# Patient Record
Sex: Male | Born: 1990 | Race: Black or African American | Hispanic: No | Marital: Single | Smoking: Current some day smoker
Health system: Southern US, Community
[De-identification: ages and names within clinical notes are randomized; demographics above are authoritative.]

## PROBLEM LIST (undated history)

## (undated) DIAGNOSIS — S62109A Fracture of unspecified carpal bone, unspecified wrist, initial encounter for closed fracture: Secondary | ICD-10-CM

---
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
## 2013-11-04 ENCOUNTER — Encounter (HOSPITAL_BASED_OUTPATIENT_CLINIC_OR_DEPARTMENT_OTHER): Payer: Self-pay | Admitting: Emergency Medicine

## 2013-11-04 ENCOUNTER — Emergency Department (HOSPITAL_BASED_OUTPATIENT_CLINIC_OR_DEPARTMENT_OTHER)
Admission: EM | Admit: 2013-11-04 | Discharge: 2013-11-04 | Disposition: A | Discharge status: Home / Self Care | Payer: Managed Care, Other (non HMO) | Attending: Emergency Medicine | Admitting: Emergency Medicine

## 2013-11-04 DIAGNOSIS — R369 Urethral discharge, unspecified: Secondary | ICD-10-CM | POA: Insufficient documentation

## 2013-11-04 DIAGNOSIS — R300 Dysuria: Secondary | ICD-10-CM | POA: Insufficient documentation

## 2013-11-04 DIAGNOSIS — J45909 Unspecified asthma, uncomplicated: Secondary | ICD-10-CM | POA: Insufficient documentation

## 2013-11-04 DIAGNOSIS — N509 Disorder of male genital organs, unspecified: Secondary | ICD-10-CM | POA: Insufficient documentation

## 2013-11-04 DIAGNOSIS — Z87828 Personal history of other (healed) physical injury and trauma: Secondary | ICD-10-CM | POA: Insufficient documentation

## 2013-11-04 LAB — URINALYSIS, ROUTINE W REFLEX MICROSCOPIC
Bilirubin Urine: NEGATIVE
Glucose, UA: NEGATIVE mg/dL
Hgb urine dipstick: NEGATIVE
Ketones, ur: NEGATIVE mg/dL
Leukocytes, UA: NEGATIVE
Nitrite: NEGATIVE
Protein, ur: NEGATIVE mg/dL
Specific Gravity, Urine: 1.019 (ref 1.005–1.030)
Urobilinogen, UA: 0.2 mg/dL (ref 0.0–1.0)
pH: 6.5 (ref 5.0–8.0)

## 2013-11-04 MED ORDER — AZITHROMYCIN 250 MG PO TABS
1000.0000 mg | ORAL_TABLET | Freq: Once | ORAL | Status: AC
  Administered 2013-11-04: 1000 mg via ORAL
  Filled 2013-11-04: qty 4

## 2013-11-04 MED ORDER — CEFTRIAXONE SODIUM 250 MG IJ SOLR
250.0000 mg | Freq: Once | INTRAMUSCULAR | Status: AC
  Administered 2013-11-04: 250 mg via INTRAMUSCULAR
  Filled 2013-11-04: qty 250
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
## 2013-11-04 NOTE — ED Notes (Signed)
Pt states has had burning on urination since Thursday morning concerned he may have std

## 2013-11-04 NOTE — ED Provider Notes (Signed)
Medical screening examination/treatment/procedure(s) were performed by non-physician practitioner and as supervising physician I was immediately available for consultation/collaboration.  EKG Interpretation   None         Charles B. Sheldon, MD 11/04/13 1334 

## 2013-11-05 LAB — URINE CULTURE
Colony Count: NO GROWTH
Culture: NO GROWTH

## 2013-11-05 LAB — GC/CHLAMYDIA PROBE AMP
CT Probe RNA: NEGATIVE
GC Probe RNA: NEGATIVE

## 2014-01-25 ENCOUNTER — Emergency Department (HOSPITAL_BASED_OUTPATIENT_CLINIC_OR_DEPARTMENT_OTHER): Payer: Managed Care, Other (non HMO)

## 2014-01-25 ENCOUNTER — Emergency Department (HOSPITAL_BASED_OUTPATIENT_CLINIC_OR_DEPARTMENT_OTHER)
Admission: EM | Admit: 2014-01-25 | Discharge: 2014-01-25 | Disposition: A | Discharge status: Home / Self Care | Payer: Managed Care, Other (non HMO) | Attending: Emergency Medicine | Admitting: Emergency Medicine

## 2014-01-25 ENCOUNTER — Encounter (HOSPITAL_BASED_OUTPATIENT_CLINIC_OR_DEPARTMENT_OTHER): Payer: Self-pay | Admitting: Emergency Medicine

## 2014-01-25 DIAGNOSIS — S81009A Unspecified open wound, unspecified knee, initial encounter: Secondary | ICD-10-CM | POA: Insufficient documentation

## 2014-01-25 DIAGNOSIS — J45909 Unspecified asthma, uncomplicated: Secondary | ICD-10-CM | POA: Insufficient documentation

## 2014-01-25 DIAGNOSIS — Y9289 Other specified places as the place of occurrence of the external cause: Secondary | ICD-10-CM | POA: Insufficient documentation

## 2014-01-25 DIAGNOSIS — Y9389 Activity, other specified: Secondary | ICD-10-CM | POA: Insufficient documentation

## 2014-01-25 DIAGNOSIS — S91009A Unspecified open wound, unspecified ankle, initial encounter: Principal | ICD-10-CM

## 2014-01-25 DIAGNOSIS — S81832A Puncture wound without foreign body, left lower leg, initial encounter: Secondary | ICD-10-CM

## 2014-01-25 DIAGNOSIS — S81809A Unspecified open wound, unspecified lower leg, initial encounter: Principal | ICD-10-CM

## 2014-01-25 DIAGNOSIS — W3400XA Accidental discharge from unspecified firearms or gun, initial encounter: Secondary | ICD-10-CM | POA: Insufficient documentation

## 2014-01-25 DIAGNOSIS — Z23 Encounter for immunization: Secondary | ICD-10-CM | POA: Insufficient documentation

## 2014-01-25 DIAGNOSIS — F17200 Nicotine dependence, unspecified, uncomplicated: Secondary | ICD-10-CM | POA: Insufficient documentation

## 2014-01-25 MED ORDER — OXYCODONE-ACETAMINOPHEN 5-325 MG PO TABS: 1.0000 | ORAL_TABLET | ORAL | Status: AC | PRN

## 2014-01-25 MED ORDER — NAPROXEN 500 MG PO TABS: 500.0000 mg | ORAL_TABLET | Freq: Two times a day (BID) | ORAL | Status: AC

## 2014-01-25 MED ORDER — DEXTROSE 5 % IV SOLN
1.0000 g | Freq: Once | INTRAVENOUS | Status: AC
  Administered 2014-01-25: 1 g via INTRAVENOUS

## 2014-01-25 MED ORDER — SODIUM CHLORIDE 0.9 % IV SOLN
Freq: Once | INTRAVENOUS | Status: AC
  Administered 2014-01-25: 17:00:00 via INTRAVENOUS

## 2014-01-25 MED ORDER — FENTANYL CITRATE 0.05 MG/ML IJ SOLN
100.0000 ug | Freq: Once | INTRAMUSCULAR | Status: AC
  Administered 2014-01-25: 100 ug via INTRAVENOUS
  Filled 2014-01-25: qty 2

## 2014-01-25 MED ORDER — CEFTRIAXONE SODIUM 1 G IJ SOLR
INTRAMUSCULAR | Status: AC
  Filled 2014-01-25: qty 10

## 2014-01-25 MED ORDER — IOHEXOL 350 MG/ML SOLN
100.0000 mL | Freq: Once | INTRAVENOUS | Status: AC | PRN
  Administered 2014-01-25: 100 mL via INTRAVENOUS

## 2014-01-25 MED ORDER — TETANUS-DIPHTH-ACELL PERTUSSIS 5-2.5-18.5 LF-MCG/0.5 IM SUSP
0.5000 mL | Freq: Once | INTRAMUSCULAR | Status: AC
  Administered 2014-01-25: 0.5 mL via INTRAMUSCULAR
  Filled 2014-01-25: qty 0.5

## 2014-01-25 NOTE — ED Notes (Signed)
[employer] at bedside 
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
## 2014-01-25 NOTE — ED Provider Notes (Signed)
Medical screening examination/treatment/procedure(s) were conducted as a shared visit with non-physician practitioner(s) and myself.  I personally evaluated the patient during the encounter     .Face to face Exam:  General:  A&Ox3 HEENT:  Atraumatic Resp:  Normal effort Abd:  Nondistended Neuro:No focal deficits     Nelia Shiobert L Kayman Snuffer, MD 01/25/14 1750
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
## 2014-01-25 NOTE — ED Notes (Addendum)
Rx x 2 given for oxycodone and naproxen- d/c home with family to drive- verbalizes understanding to [facility] any concerns. Note for work given per [place], NP

## 2014-01-25 NOTE — ED Notes (Signed)
Patient transported to CT 

## 2014-06-15 ENCOUNTER — Encounter (HOSPITAL_BASED_OUTPATIENT_CLINIC_OR_DEPARTMENT_OTHER): Payer: Self-pay | Admitting: Emergency Medicine

## 2014-06-15 ENCOUNTER — Emergency Department (HOSPITAL_BASED_OUTPATIENT_CLINIC_OR_DEPARTMENT_OTHER)
Admission: EM | Admit: 2014-06-15 | Discharge: 2014-06-15 | Disposition: A | Discharge status: Home / Self Care | Payer: Managed Care, Other (non HMO) | Attending: Emergency Medicine | Admitting: Emergency Medicine

## 2014-06-15 DIAGNOSIS — F17200 Nicotine dependence, unspecified, uncomplicated: Secondary | ICD-10-CM | POA: Insufficient documentation

## 2014-06-15 DIAGNOSIS — K047 Periapical abscess without sinus: Secondary | ICD-10-CM | POA: Insufficient documentation

## 2014-06-15 DIAGNOSIS — Z8781 Personal history of (healed) traumatic fracture: Secondary | ICD-10-CM | POA: Insufficient documentation

## 2014-06-15 DIAGNOSIS — Z791 Long term (current) use of non-steroidal anti-inflammatories (NSAID): Secondary | ICD-10-CM | POA: Insufficient documentation

## 2014-06-15 DIAGNOSIS — J45909 Unspecified asthma, uncomplicated: Secondary | ICD-10-CM | POA: Insufficient documentation

## 2014-06-15 DIAGNOSIS — K029 Dental caries, unspecified: Secondary | ICD-10-CM | POA: Insufficient documentation

## 2014-06-15 MED ORDER — HYDROCODONE-ACETAMINOPHEN 5-325 MG PO TABS: 1.0000 | ORAL_TABLET | Freq: Four times a day (QID) | ORAL | Status: AC | PRN

## 2014-06-15 MED ORDER — CLINDAMYCIN HCL 150 MG PO CAPS: 150.0000 mg | ORAL_CAPSULE | Freq: Three times a day (TID) | ORAL | Status: AC

## 2014-06-15 NOTE — Discharge Instructions (Signed)
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
## 2014-06-15 NOTE — ED Notes (Addendum)
Pt c/o dental pain x 3 days facial swelling x 1 day

## 2014-06-15 NOTE — ED Provider Notes (Signed)
Medical screening examination/treatment/procedure(s) were performed by non-physician practitioner and as supervising physician I was immediately available for consultation/collaboration.   EKG Interpretation None       Martha K Linker, MD 06/15/14 1542 

## 2018-01-22 ENCOUNTER — Other Ambulatory Visit: Payer: Self-pay

## 2018-01-22 ENCOUNTER — Emergency Department (HOSPITAL_BASED_OUTPATIENT_CLINIC_OR_DEPARTMENT_OTHER)
Admission: EM | Admit: 2018-01-22 | Discharge: 2018-01-22 | Disposition: A | Discharge status: Home / Self Care | Payer: Managed Care, Other (non HMO) | Attending: Emergency Medicine | Admitting: Emergency Medicine

## 2018-01-22 ENCOUNTER — Encounter (HOSPITAL_BASED_OUTPATIENT_CLINIC_OR_DEPARTMENT_OTHER): Payer: Self-pay | Admitting: *Deleted

## 2018-01-22 DIAGNOSIS — N342 Other urethritis: Secondary | ICD-10-CM | POA: Insufficient documentation

## 2018-01-22 DIAGNOSIS — F17290 Nicotine dependence, other tobacco product, uncomplicated: Secondary | ICD-10-CM | POA: Insufficient documentation

## 2018-01-22 DIAGNOSIS — F17210 Nicotine dependence, cigarettes, uncomplicated: Secondary | ICD-10-CM | POA: Insufficient documentation

## 2018-01-22 DIAGNOSIS — J45909 Unspecified asthma, uncomplicated: Secondary | ICD-10-CM | POA: Insufficient documentation

## 2018-01-22 DIAGNOSIS — F1210 Cannabis abuse, uncomplicated: Secondary | ICD-10-CM | POA: Insufficient documentation

## 2018-01-22 MED ORDER — AZITHROMYCIN 250 MG PO TABS
1000.0000 mg | ORAL_TABLET | Freq: Once | ORAL | Status: AC
  Administered 2018-01-22: 1000 mg via ORAL
  Filled 2018-01-22: qty 4

## 2018-01-22 MED ORDER — CEFTRIAXONE SODIUM 250 MG IJ SOLR
250.0000 mg | Freq: Once | INTRAMUSCULAR | Status: AC
  Administered 2018-01-22: 250 mg via INTRAMUSCULAR
  Filled 2018-01-22: qty 250

## 2018-01-22 NOTE — ED Notes (Signed)
Pt encourage to provide urine
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
## 2018-01-22 NOTE — ED Triage Notes (Signed)
Painful urination x 2-3 days. Penile discharge today

## 2018-01-23 LAB — GC/CHLAMYDIA PROBE AMP (~~LOC~~) NOT AT ARMC
Chlamydia: NEGATIVE
Neisseria Gonorrhea: POSITIVE — AB

## 2020-02-12 ENCOUNTER — Other Ambulatory Visit: Payer: Self-pay

## 2020-02-12 ENCOUNTER — Emergency Department (HOSPITAL_BASED_OUTPATIENT_CLINIC_OR_DEPARTMENT_OTHER)
Admission: EM | Admit: 2020-02-12 | Discharge: 2020-02-12 | Disposition: A | Discharge status: Home / Self Care | Payer: 59 | Attending: Emergency Medicine | Admitting: Emergency Medicine

## 2020-02-12 ENCOUNTER — Encounter (HOSPITAL_BASED_OUTPATIENT_CLINIC_OR_DEPARTMENT_OTHER): Payer: Self-pay | Admitting: Emergency Medicine

## 2020-02-12 ENCOUNTER — Emergency Department (HOSPITAL_BASED_OUTPATIENT_CLINIC_OR_DEPARTMENT_OTHER): Payer: 59

## 2020-02-12 DIAGNOSIS — M25521 Pain in right elbow: Secondary | ICD-10-CM | POA: Diagnosis present

## 2020-02-12 DIAGNOSIS — J45909 Unspecified asthma, uncomplicated: Secondary | ICD-10-CM | POA: Insufficient documentation

## 2020-02-12 DIAGNOSIS — Z79899 Other long term (current) drug therapy: Secondary | ICD-10-CM | POA: Diagnosis not present

## 2020-02-12 DIAGNOSIS — F17210 Nicotine dependence, cigarettes, uncomplicated: Secondary | ICD-10-CM | POA: Diagnosis not present

## 2020-02-12 DIAGNOSIS — Z791 Long term (current) use of non-steroidal anti-inflammatories (NSAID): Secondary | ICD-10-CM | POA: Diagnosis not present

## 2020-02-12 DIAGNOSIS — M7701 Medial epicondylitis, right elbow: Secondary | ICD-10-CM | POA: Diagnosis not present

## 2020-02-12 DIAGNOSIS — G5621 Lesion of ulnar nerve, right upper limb: Secondary | ICD-10-CM

## 2020-02-12 MED ORDER — KETOROLAC TROMETHAMINE 15 MG/ML IJ SOLN
15.0000 mg | Freq: Once | INTRAMUSCULAR | Status: AC
  Administered 2020-02-12: 15 mg via INTRAMUSCULAR
  Filled 2020-02-12: qty 1

## 2020-02-12 MED ORDER — METHOCARBAMOL 500 MG PO TABS: 500.0000 mg | ORAL_TABLET | Freq: Two times a day (BID) | ORAL | 0 refills | Status: AC

## 2020-02-12 MED ORDER — OXYCODONE-ACETAMINOPHEN 5-325 MG PO TABS
1.0000 | ORAL_TABLET | Freq: Once | ORAL | Status: AC
  Administered 2020-02-12: 17:00:00 1 via ORAL
  Filled 2020-02-12: qty 1

## 2020-02-12 MED ORDER — METHOCARBAMOL 500 MG PO TABS
500.0000 mg | ORAL_TABLET | Freq: Once | ORAL | Status: AC
  Administered 2020-02-12: 19:00:00 500 mg via ORAL
  Filled 2020-02-12: qty 1

## 2020-02-12 NOTE — ED Notes (Signed)
Pt resting calmly in chair.

## 2020-02-12 NOTE — ED Triage Notes (Signed)
R arm pain today since 1pm. Denies injury.

## 2020-02-12 NOTE — Discharge Instructions (Addendum)
You have been diagnosed today with right medial epicondylitis, right ulnar neuropathy.  At this time there does not appear to be the presence of an emergent medical condition, however there is always the potential for conditions to change. Please read and follow the below instructions.  Please return to the Emergency Department immediately for any new or worsening symptoms. Please be sure to follow up with your Primary Care Provider within one week regarding your visit today; please call their office to schedule an appointment even if you are feeling better for a follow-up visit. You have been given an NSAID-containing medication called Toradol today.  Do not take the medications including ibuprofen, Aleve, Advil, naproxen or other NSAID-containing medications for the next 2 days.  Please be sure to drink plenty of water over the next few days. You may use the muscle relaxer Robaxin as prescribed to help with your symptoms.  Do not drive or operate heavy machinery while taking Robaxin as it will make you drowsy.  Do not drink alcohol or take other sedating medications while taking Robaxin as this will worsen side effects. You may use rest, ice and elevation to help with your symptoms.  He may take Tylenol as directed on the packaging to help with your symptoms. Please call the hand specialist Dr. Merlyn Lot on your discharge paperwork to schedule a follow-up appointment for further evaluation.  Get help right away if: Your pain is severe. You cannot move your wrist. You have fever or chills You have swelling or color change You have any new/concerning or worsening of symptoms   Please read the additional information packets attached to your discharge summary.  Do not take your medicine if  develop an itchy rash, swelling in your mouth or lips, or difficulty breathing; call 911 and seek immediate emergency medical attention if this occurs.  Note: Portions of this text may have been transcribed using  voice recognition software. Every effort was made to ensure accuracy; however, inadvertent computerized transcription errors may still be present.

## 2020-02-12 NOTE — ED Provider Notes (Signed)
[facility] EMERGENCY DEPARTMENT Provider Note   CSN: [id number] Arrival date & time: 02/12/20  1648     History Chief Complaint  Patient presents with  . Arm Pain    Karl Bradford is a 29 y.o. male with history of asthma, otherwise healthy.  Patient reports that he was sitting in his car in the driver seat around 1 PM today.  He reports he then pushed himself up out of his seat using his elbow on the armrest.  Immediately after he stepped on the car he noticed pain to the medial side of his right elbow, severe throbbing constant radiating down towards his right fourth and fifth fingers worsened with movement and without alleviating factors.  No medications attempted prior to arrival.  Associated symptoms, tingling sensation of the fourth and fifth finger when pain is most severe.  He denies similar pain in the past, he denies any fall/injury, fever/chills, swelling/color change, wound, neck pain, back pain, chest pain, abdominal pain, nausea/vomiting, speech difficulty, dizziness, headache or any additional concerns.  HPI     Past Medical History:  Diagnosis Date  . Asthma   . Broken wrist     There are no problems to display for this patient.   History reviewed. No pertinent surgical history.     No family history on file.  Social History   Tobacco Use  . Smoking status: Current Some Day Smoker    Packs/day: 0.50    Types: Cigarettes, Cigars  . Smokeless tobacco: Never Used  Substance Use Topics  . Alcohol use: Yes  . Drug use: Yes    Types: Marijuana    Comment: rare    Home Medications Prior to Admission medications   Medication Sig Start Date End Date Taking? Authorizing Provider  clindamycin (CLEOCIN) 150 MG capsule Take 1 capsule (150 mg total) by mouth 3 (three) times daily. 06/15/14   Teressa Lower, NP  HYDROcodone-acetaminophen (NORCO/VICODIN) 5-325 MG per tablet Take 1-2 tablets by mouth every 6 (six) hours as needed. 06/15/14   Teressa Lower, NP  methocarbamol (ROBAXIN) 500 MG tablet Take 1 tablet (500 mg total) by mouth 2 (two) times daily for 7 days. 02/12/20 02/19/20  Harlene Salts A, PA-C  naproxen (NAPROSYN) 500 MG tablet Take 1 tablet (500 mg total) by mouth 2 (two) times daily. 01/25/14   Janne Napoleon, NP  oxyCODONE-acetaminophen (ROXICET) 5-325 MG per tablet Take 1 tablet by mouth every 4 (four) hours as needed for severe pain. 01/25/14   Janne Napoleon, NP    Allergies    Other  Review of Systems   Review of Systems  Constitutional: Negative for fever.  Cardiovascular: Negative for chest pain.  Gastrointestinal: Negative for abdominal pain, nausea and vomiting.  Musculoskeletal: Positive for arthralgias (Right elbow). Negative for back pain, joint swelling and neck pain.  Skin: Negative.  Negative for color change and wound.  Neurological: Positive for numbness (Tingling of fourth and fifth fingers of the right hand). Negative for dizziness, speech difficulty, weakness and headaches.    Physical Exam Updated Vital Signs BP (!) 150/90 (BP Location: Left Arm)   Pulse 74   Temp 97.8 F (36.6 C) (Oral)   Resp 18   Ht 5\' 11"  (1.803 m)   Wt 79.8 kg   SpO2 99%   BMI 24.55 kg/m   Physical Exam Constitutional:      General: He is not in acute distress.    Appearance: Normal appearance. He is well-developed. He  is not ill-appearing or diaphoretic.  HENT:     Head: Normocephalic and atraumatic.     Right Ear: External ear normal.     Left Ear: External ear normal.     Nose: Nose normal.  Eyes:     General: Vision grossly intact. Gaze aligned appropriately.     Pupils: Pupils are equal, round, and reactive to light.  Neck:     Trachea: Trachea and phonation normal. No tracheal deviation.  Pulmonary:     Effort: Pulmonary effort is normal. No respiratory distress.  Abdominal:     General: There is no distension.     Palpations: Abdomen is soft.     Tenderness: There is no abdominal tenderness. There is  no guarding or rebound.  Musculoskeletal:        General: Normal range of motion.     Right shoulder: Normal.     Left shoulder: Normal.     Right upper arm: Normal.     Left upper arm: Normal.     Right elbow: Tenderness present in medial epicondyle. No lateral epicondyle or olecranon process tenderness.     Left elbow: Normal.     Right forearm: Normal.     Left forearm: Normal.     Right wrist: Normal.     Left wrist: Normal.     Right hand: No swelling. Normal range of motion. Normal strength. Decreased sensation (Reports questionable decreased sensation fourth and fifth fingers). Normal capillary refill.     Left hand: Normal.     Cervical back: Normal range of motion.  Skin:    General: Skin is warm and dry.  Neurological:     Mental Status: He is alert.     GCS: GCS eye subscore is 4. GCS verbal subscore is 5. GCS motor subscore is 6.     Comments: Speech is clear and goal oriented, follows commands Major Cranial nerves without deficit, no facial droop Normal strength in upper and lower extremities bilaterally including dorsiflexion and plantar flexion, strong and equal grip strength Sensation normal to light and sharp touch Moves extremities without ataxia, coordination intact Normal gait  Psychiatric:        Behavior: Behavior normal.    ED Results / Procedures / Treatments   Labs (all labs ordered are listed, but only abnormal results are displayed) Labs Reviewed - No data to display  EKG None  Radiology DG Elbow Complete Right  Result Date: 02/12/2020 CLINICAL DATA:  Tenderness around medial epicondyle. Reports ulnar distribution pain. Sudden onset of elbow pain today. EXAM: RIGHT ELBOW - COMPLETE 3+ VIEW COMPARISON:  None. FINDINGS: There is no evidence of fracture, dislocation, or joint effusion. There is no evidence of arthropathy or other focal bone abnormality. Soft tissues are unremarkable. IMPRESSION: Negative radiographs of the right elbow. Electronically  Signed   By: Narda Rutherford M.D.   On: 02/12/2020 18:02    Procedures Procedures (including critical care time)  Medications Ordered in ED Medications  ketorolac (TORADOL) 15 MG/ML injection 15 mg (15 mg Intramuscular Given 02/12/20 1718)  oxyCODONE-acetaminophen (PERCOCET/ROXICET) 5-325 MG per tablet 1 tablet (1 tablet Oral Given 02/12/20 1717)  methocarbamol (ROBAXIN) tablet 500 mg (500 mg Oral Given 02/12/20 1853)    ED Course  I have reviewed the triage vital signs and the nursing notes.  Pertinent labs & imaging results that were available during my care of the patient were reviewed by me and considered in my medical decision making (see chart for details).  MDM Rules/Calculators/A&P                      29 year old male presents today for right elbow pain onset around 1 PM today after getting out of his car.  He thinks he may have pushed off against the armrest with his right elbow just prior to onset of pain.  He describes pain of the medial epicondyle of the right elbow.  He is tender to palpation of the medial epicondyle and the cubital tunnel there is no sign of injury or overlying skin change.  He has no pain of the neck shoulder upper arm.  He has no evidence of DVT, cellulitis, septic arthritis, compartment syndrome or other emergent pathologies.  Feel history and physical examination today are consistent with medial epicondylitis and ulnar neuropathy.  He has some diminished sensation of the fourth and fifth fingers but can feel touch of those areas.  He is good capillary refill and equal strength of bilateral upper extremities.  Will obtain x-ray of the right elbow today and control pain. - DG Right Elbow: IMPRESSION:  Negative radiographs of the right elbow.   I have personally reviewed patient's x-rays and agree with radiologist interpretation. - Patient was treated with muscle relaxer, Toradol and 1 Percocet.  No history of CKD or gastric ulcers.  On reevaluation he is  resting comfortably no acute distress.  He reports improvement of pain, pain is minimal at this time and paresthesias have improved.  I discussed limitations of x-rays as above and he states understanding.  Will give referral to hand specialist for follow-up.  As above suspect this to be a medial epicondylitis with an improved ulnar neuropathy.  Do not feel any additional imaging or work-up is indicated the ER at this time.  I discussed rice therapy with patient and he states understanding he plans to call hand specialist tomorrow.  Patient has ride home today from the ER.  He has been informed of muscle relaxer precautions and states understanding.  At this time there does not appear to be any evidence of an acute emergency medical condition and the patient appears stable for discharge with appropriate outpatient follow up. Diagnosis was discussed with patient who verbalizes understanding of care plan and is agreeable to discharge. I have discussed return precautions with patient who verbalizes understanding of return precautions. Patient encouraged to follow-up with their PCP and hand. All questions answered.  Patient's case discussed with Dr. Regenia Skeeter who agrees with plan to discharge with follow-up.   Note: Portions of this report may have been transcribed using voice recognition software. Every effort was made to ensure accuracy; however, inadvertent computerized transcription errors may still be present. Final Clinical Impression(s) / ED Diagnoses Final diagnoses:  Medial epicondylitis of right elbow  Ulnar neuropathy at elbow of right upper extremity    Rx / DC Orders ED Discharge Orders         Ordered    methocarbamol (ROBAXIN) 500 MG tablet  2 times daily     02/12/20 1954           Gari Crown 02/12/20 Eugene Garnet, MD 02/13/20 [phone]

## 2021-11-01 IMAGING — DX DG ELBOW COMPLETE 3+V*R*
4 series · 4 of 4 positions shown · non-contrast
Comparison: None.

CLINICAL DATA: Tenderness around medial epicondyle. Reports ulnar
distribution pain. Sudden onset of elbow pain today.

EXAM:
RIGHT ELBOW - COMPLETE 3+ VIEW

[elbow ap]
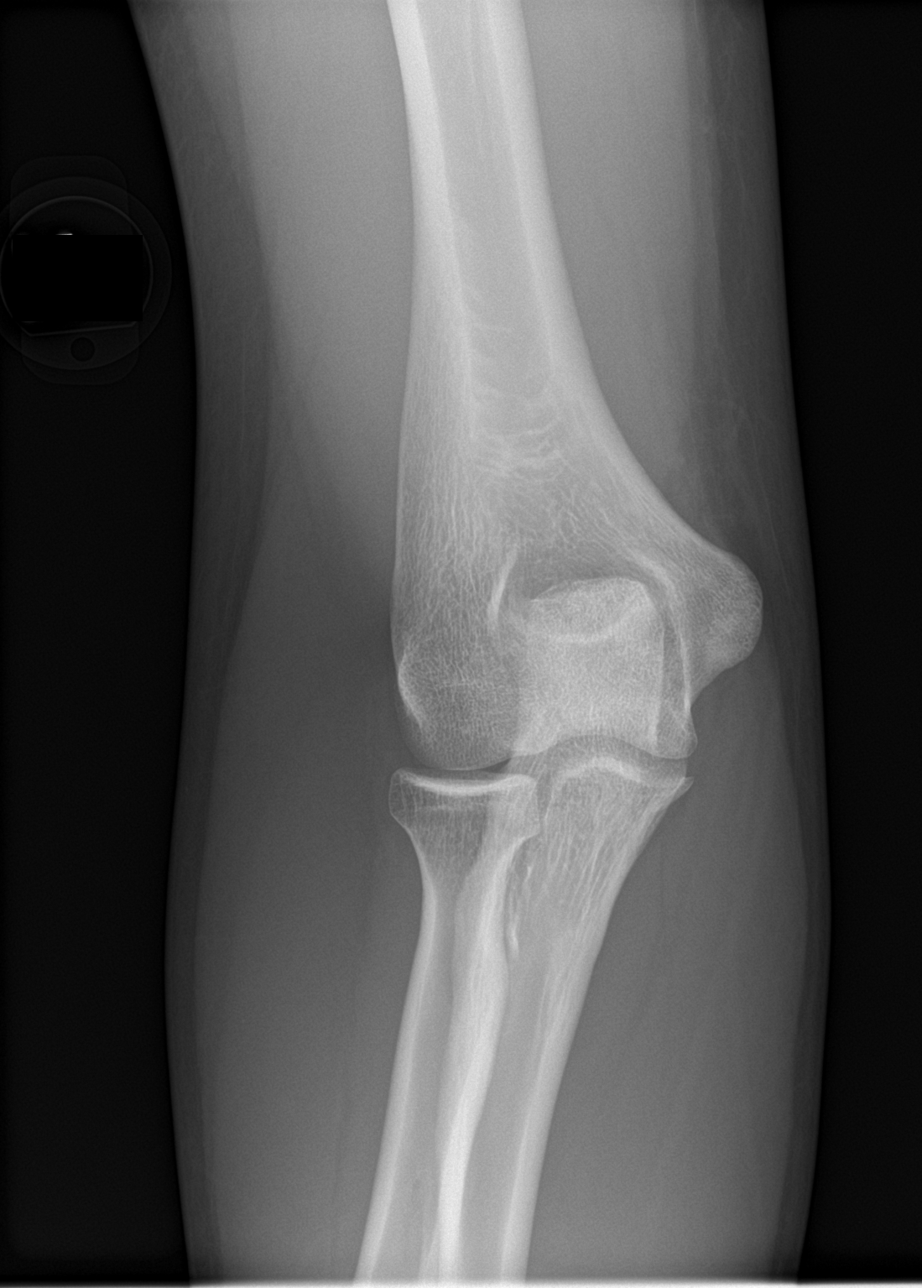

[elbow obl (1 of 2)]
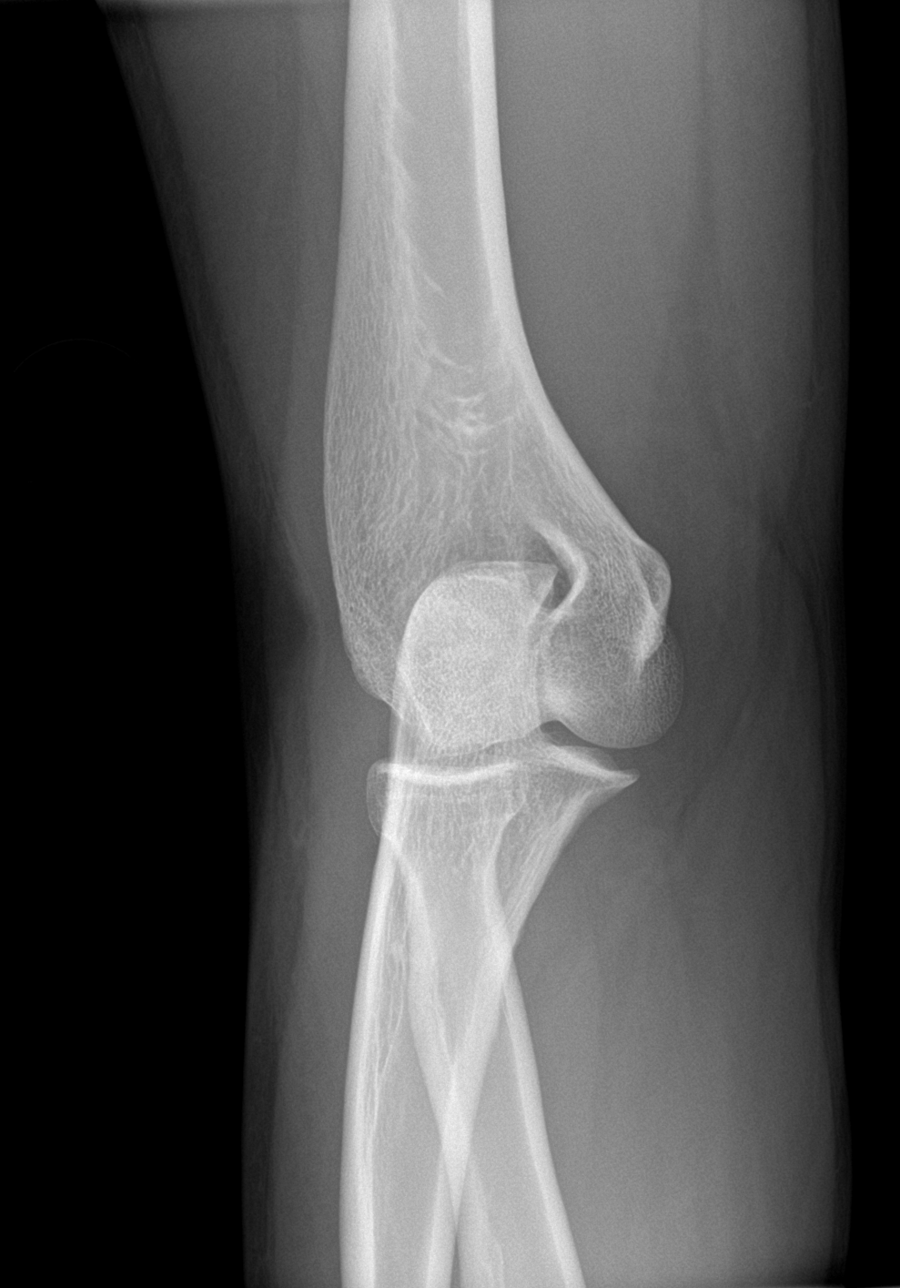

[elbow obl (2 of 2)]
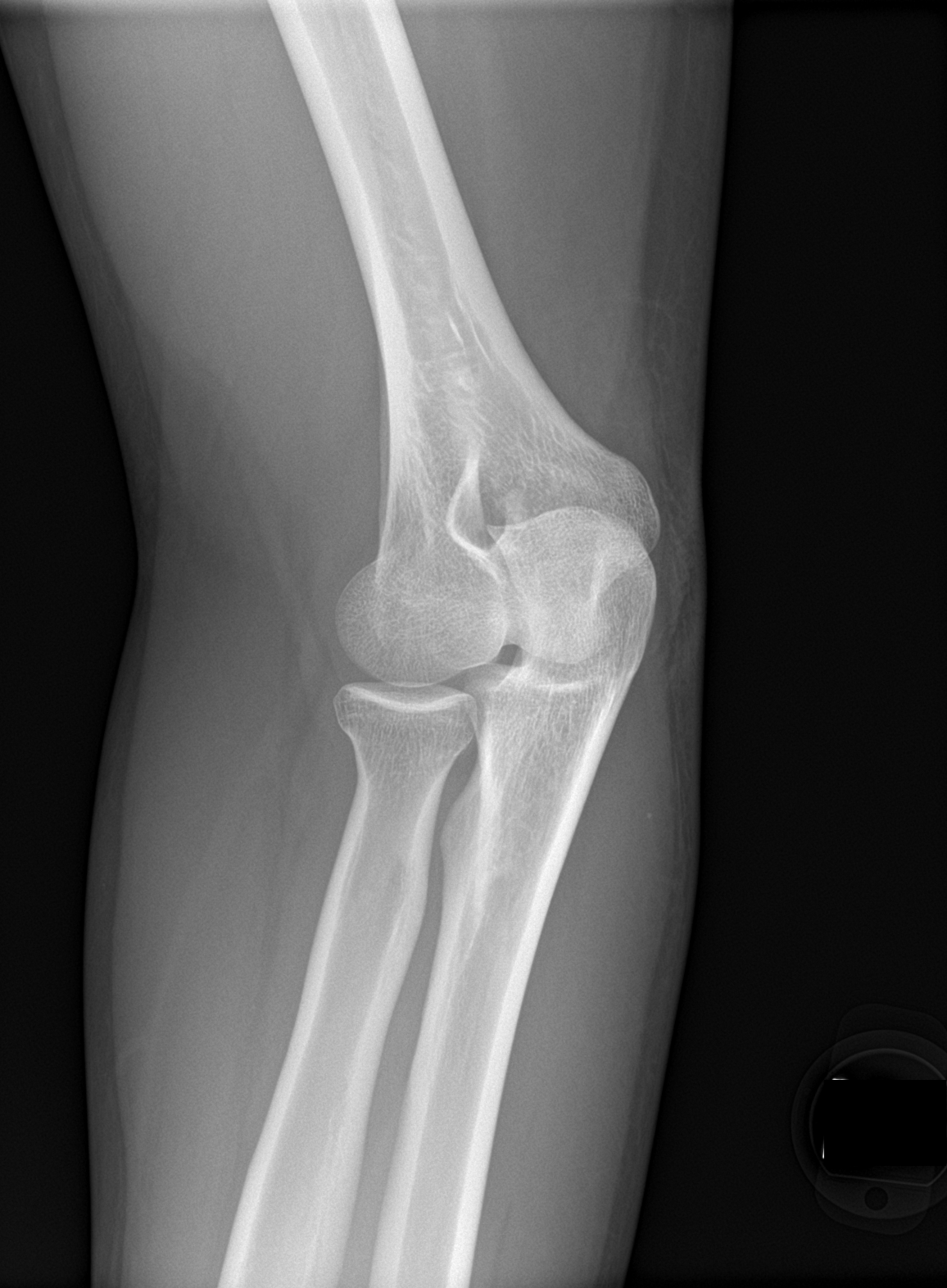

[elbow lat]
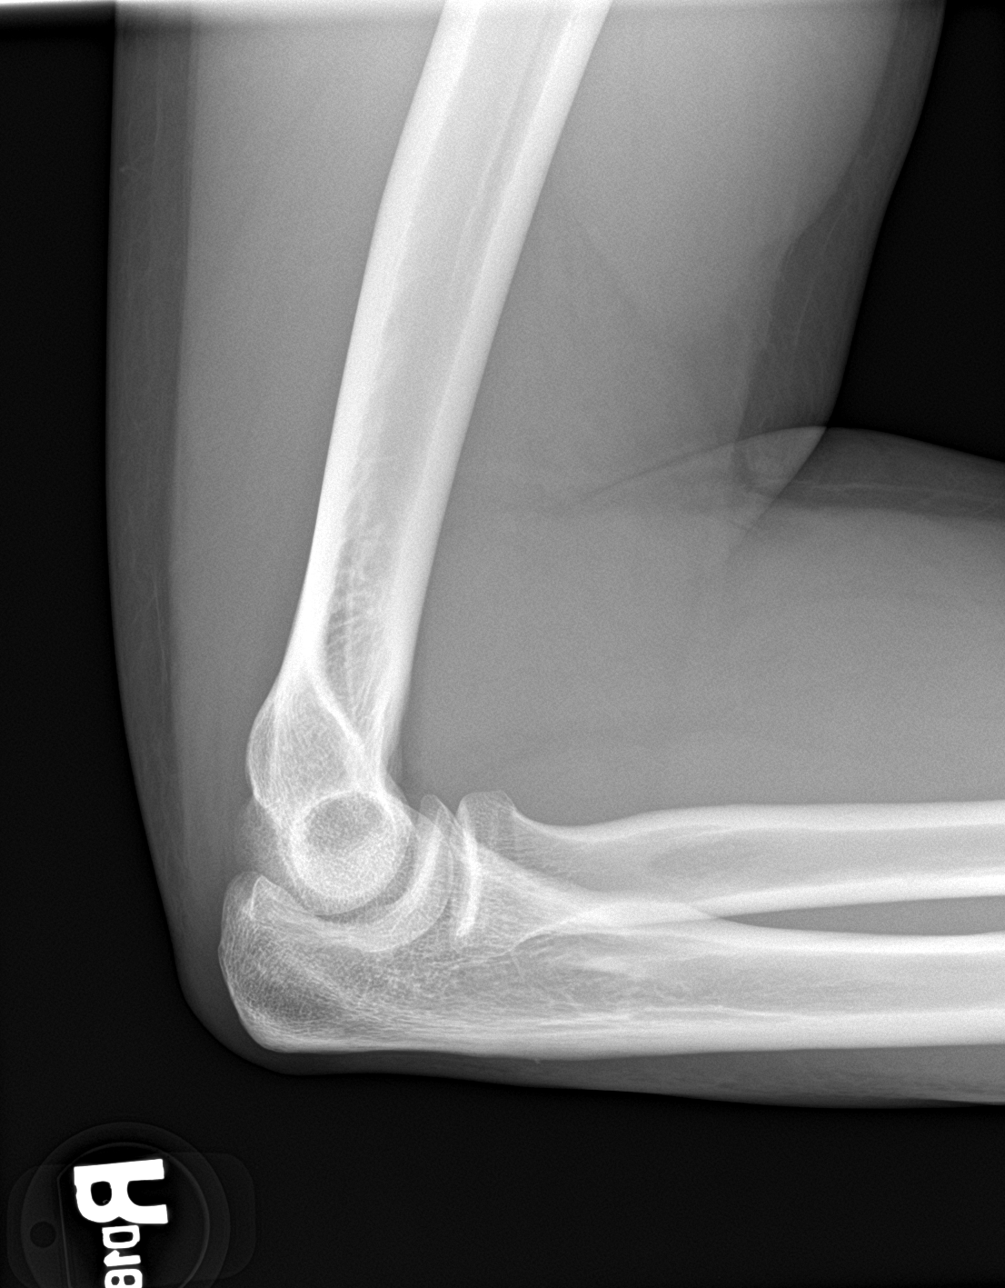

[4 of 4 positions shown; findings below may reference images not displayed]

FINDINGS: There is no evidence of fracture, dislocation, or joint effusion.
There is no evidence of arthropathy or other focal bone abnormality.
Soft tissues are unremarkable.
IMPRESSION: Negative radiographs of the right elbow.

## 2023-05-23 ENCOUNTER — Other Ambulatory Visit (HOSPITAL_BASED_OUTPATIENT_CLINIC_OR_DEPARTMENT_OTHER): Payer: Self-pay

## 2023-05-23 ENCOUNTER — Emergency Department (HOSPITAL_BASED_OUTPATIENT_CLINIC_OR_DEPARTMENT_OTHER)
Admission: EM | Admit: 2023-05-23 | Discharge: 2023-05-23 | Disposition: A | Discharge status: Home / Self Care | Payer: BC Managed Care – PPO | Attending: Emergency Medicine | Admitting: Emergency Medicine

## 2023-05-23 ENCOUNTER — Other Ambulatory Visit: Payer: Self-pay

## 2023-05-23 DIAGNOSIS — K047 Periapical abscess without sinus: Secondary | ICD-10-CM | POA: Diagnosis not present

## 2023-05-23 DIAGNOSIS — R220 Localized swelling, mass and lump, head: Secondary | ICD-10-CM | POA: Diagnosis present

## 2023-05-23 DIAGNOSIS — J45909 Unspecified asthma, uncomplicated: Secondary | ICD-10-CM | POA: Insufficient documentation

## 2023-05-23 MED ORDER — IBUPROFEN 800 MG PO TABS
800.0000 mg | ORAL_TABLET | Freq: Three times a day (TID) | ORAL | 0 refills | Status: AC | PRN
  Filled 2023-05-23 (×2): qty 21, 7d supply, fill #0

## 2023-05-23 MED ORDER — AMOXICILLIN 500 MG PO CAPS
500.0000 mg | ORAL_CAPSULE | Freq: Three times a day (TID) | ORAL | 0 refills | Status: AC
  Filled 2023-05-23 (×2): qty 21, 7d supply, fill #0

## 2023-05-23 NOTE — Discharge Instructions (Signed)
You have been seen today for your complaint of facial swelling. Your discharge medications include amoxicillin. This is an antibiotic. You should take it as prescribed. You should take it for the entire duration of the prescription. This may cause an upset stomach. This is normal. You may take this with food. You may also eat yogurt to prevent diarrhea.  Alternate tylenol and ibuprofen for pain. You may alternate these every 4 hours. You may take up to 800 mg of ibuprofen at a time and up to 1000 mg of tylenol. Follow up with: Your dentist sooner if possible Please seek immediate medical care if you develop any of the following symptoms: Your symptoms suddenly get worse. You have a very bad headache. You have problems breathing or swallowing. You have trouble opening your mouth. You have swelling in your neck or around your eye. At this time there does not appear to be the presence of an emergent medical condition, however there is always the potential for conditions to change. Please read and follow the below instructions.  Do not take your medicine if  develop an itchy rash, swelling in your mouth or lips, or difficulty breathing; call 911 and seek immediate emergency medical attention if this occurs.  You may review your lab tests and imaging results in their entirety on your MyChart account.  Please discuss all results of fully with your primary care provider and other specialist at your follow-up visit.  Note: Portions of this text may have been transcribed using voice recognition software. Every effort was made to ensure accuracy; however, inadvertent computerized transcription errors may still be present.

## 2023-05-23 NOTE — ED Provider Notes (Signed)
[place] EMERGENCY DEPARTMENT AT [facility] Provider Note   CSN: [id number] Arrival date & time: 05/23/23  4098     History  Chief Complaint  Patient presents with   Facial Pain    Karl Bradford is a 31 y.o. male.  With history of asthma who presents to the ED for evaluation of right-sided facial pain and swelling.  He states he noticed the swelling 2 days ago.  Yesterday he noticed some pain.  Pain is localized to the preauricular area with some radiation to the ear canal.  Describes this as a sharp and shooting pain.  Patient denies fevers, new medications, intraoral swelling, difficulty chewing or swallowing, difficulty breathing.  States he has needed some dental work for quite some time.  Has an appointment with his dentist next month.  He takes ibuprofen with improvement in his symptoms.  HPI     Home Medications Prior to Admission medications   Medication Sig Start Date End Date Taking? Authorizing Provider  amoxicillin (AMOXIL) 500 MG capsule Take 1 capsule (500 mg total) by mouth 3 (three) times daily. 05/23/23  Yes Schon Zeiders, Edsel Petrin, PA-C  ibuprofen (ADVIL) 800 MG tablet Take 1 tablet (800 mg total) by mouth every 8 (eight) hours as needed. 05/23/23  Yes Albin Duckett, Edsel Petrin, PA-C  clindamycin (CLEOCIN) 150 MG capsule Take 1 capsule (150 mg total) by mouth 3 (three) times daily. 06/15/14   Teressa Lower, NP  HYDROcodone-acetaminophen (NORCO/VICODIN) 5-325 MG per tablet Take 1-2 tablets by mouth every 6 (six) hours as needed. 06/15/14   Teressa Lower, NP  naproxen (NAPROSYN) 500 MG tablet Take 1 tablet (500 mg total) by mouth 2 (two) times daily. 01/25/14   Janne Napoleon, NP  oxyCODONE-acetaminophen (ROXICET) 5-325 MG per tablet Take 1 tablet by mouth every 4 (four) hours as needed for severe pain. 01/25/14   Janne Napoleon, NP      Allergies    Other    Review of Systems   Review of Systems  HENT:  Positive for facial swelling.   All other systems reviewed  and are negative.   Physical Exam Updated Vital Signs BP (!) 133/99   Pulse 80   Temp 98.4 F (36.9 C)   Resp 16   Ht 5\' 11"  (1.803 m)   Wt 90.7 kg   SpO2 100%   BMI 27.89 kg/m  Physical Exam Vitals and nursing note reviewed.  Constitutional:      General: He is not in acute distress.    Appearance: Normal appearance. He is normal weight. He is not ill-appearing.  HENT:     Head: Normocephalic and atraumatic.     Comments: Mild swelling to the right side of the face mostly overlying the right maxilla.  No erythema fluctuance or induration    Right Ear: Tympanic membrane and ear canal normal.     Left Ear: Tympanic membrane and ear canal normal.     Ears:     Comments: No mastoid tenderness, erythema, or swelling bilaterally    Mouth/Throat:     Mouth: Mucous membranes are moist.     Pharynx: Oropharynx is clear.     Comments: Poor dentition throughout with multiple dental caries.  No purulent drainage appreciated.  Halitosis appreciated.  No drooling, trismus or tripoding.  Soft palate rises with phonation.  Tongue resting on the floor of the mouth Pulmonary:     Effort: Pulmonary effort is normal. No respiratory distress.  Abdominal:  General: Abdomen is flat.  Musculoskeletal:        General: Normal range of motion.     Cervical back: Neck supple.  Skin:    General: Skin is warm and dry.  Neurological:     Mental Status: He is alert and oriented to person, place, and time.  Psychiatric:        Mood and Affect: Mood normal.        Behavior: Behavior normal.     ED Results / Procedures / Treatments   Labs (all labs ordered are listed, but only abnormal results are displayed) Labs Reviewed - No data to display  EKG None  Radiology No results found.  Procedures Procedures    Medications Ordered in ED Medications - No data to display  ED Course/ Medical Decision Making/ A&P                             Medical Decision Making This patient presents  to the ED for concern of right-sided facial swelling, this involves an extensive number of treatment options, and is a complaint that carries with it a high risk of complications and morbidity.  The differential diagnosis includes cellulitis, periapical or other periodontal abscess, parotitis sialoadenitis or sialolithiasis  Co morbidities that complicate the patient evaluation  asthma  Additional history obtained from: Nursing notes from this visit.  Afebrile, hemodynamically stable.  32 year old male presenting to the ED for evaluation of right-sided facial swelling.  This began 2 days ago.  He noticed some pain yesterday.  This is described as sharp and shooting.  No difficulty chewing, swallowing, breathing.  No lip or tongue swelling.  Does have poor dentition throughout with halitosis.  Likely a dental infection.  Will treat with amoxicillin and ibuprofen.  He was complaining of some posterior auricular pain, however this was clarified to be pain right near the entrance of the ear canal.  Mastoids nontender, nonswollen, nonerythematous.  Low suspicion for mastoiditis.  He was encouraged to follow-up with his dentist sooner if possible.  He was given return precautions.  Stable at discharge.  At this time there does not appear to be any evidence of an acute emergency medical condition and the patient appears stable for discharge with appropriate outpatient follow up. Diagnosis was discussed with patient who verbalizes understanding of care plan and is agreeable to discharge. I have discussed return precautions with patient who verbalizes understanding. Patient encouraged to follow-up with their PCP within 1 week. All questions answered.  Note: Portions of this report may have been transcribed using voice recognition software. Every effort was made to ensure accuracy; however, inadvertent computerized transcription errors may still be present.         Final Clinical Impression(s) / ED  Diagnoses Final diagnoses:  Dental infection    Rx / DC Orders ED Discharge Orders          Ordered    amoxicillin (AMOXIL) 500 MG capsule  3 times daily        05/23/23 0953    ibuprofen (ADVIL) 800 MG tablet  Every 8 hours PRN        05/23/23 0953              Michelle Piper, PA-C 05/23/23 1012    Melene Plan, DO 05/23/23 1031

## 2023-05-23 NOTE — ED Triage Notes (Signed)
Pt reports swelling behind right ear since Monday . Swelling increase . Woke up with pain that increases when he turns head

## 2023-05-23 NOTE — ED Notes (Signed)
Reviewed discharge instructions and medications with pt. Pt states understanding
